# Patient Record
Sex: Male | Born: 1990 | Race: White | Hispanic: No | Marital: Single | Smoking: Former smoker
Health system: Southern US, Community
[De-identification: ages and names within clinical notes are randomized; demographics above are authoritative.]

---
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
## 2012-07-06 ENCOUNTER — Ambulatory Visit: Payer: Self-pay | Admitting: Internal Medicine

## 2012-07-06 LAB — RAPID STREP-A WITH REFLX: Micro Text Report: NEGATIVE

## 2012-07-09 LAB — BETA STREP CULTURE(ARMC)

## 2013-06-18 ENCOUNTER — Ambulatory Visit: Payer: Self-pay | Admitting: Family Medicine

## 2013-06-28 ENCOUNTER — Ambulatory Visit: Payer: Self-pay | Admitting: Neurology

## 2013-07-01 ENCOUNTER — Ambulatory Visit: Payer: Self-pay | Admitting: Neurology

## 2014-01-30 HISTORY — PX: WRIST FRACTURE SURGERY: SHX121

## 2014-12-27 IMAGING — CR PARANASAL SINUSES - COMPLETE 3 + VIEW
1 series · 5 of 5 positions shown · non-contrast
Comparison: none

REASON FOR EXAM: headaches
COMMENTS:

PROCEDURE:     MDR - MDR SINUSES PARANASAL COMPLETE  - June 18, 2013  [DATE]
RESULT:

[Series 1: pa waters · 0.17mm/px · 5 of 5 slices shown]
[im 1/5]
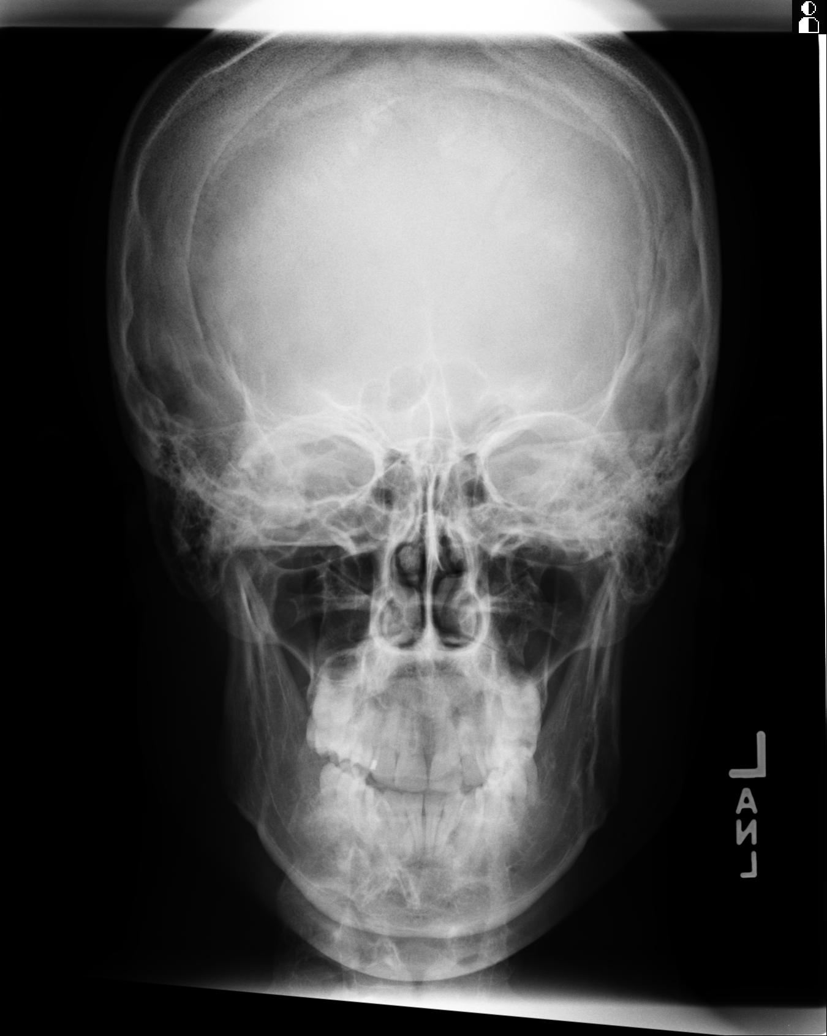
[im 2/5]
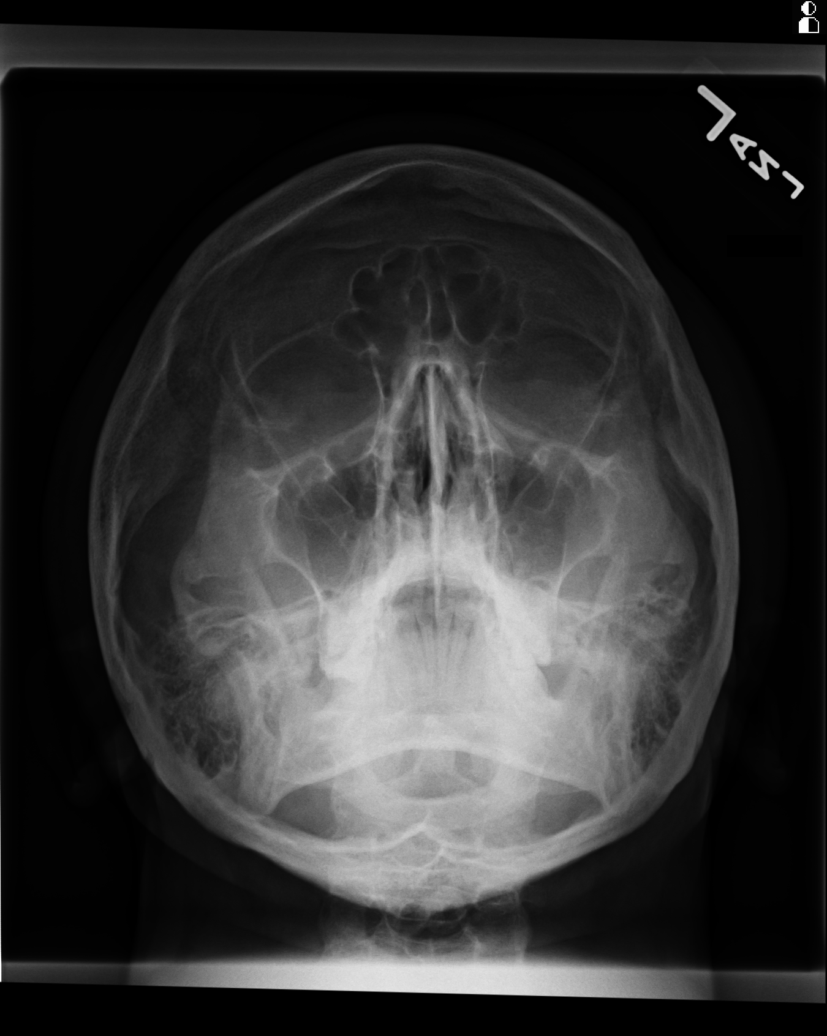
[im 3/5]
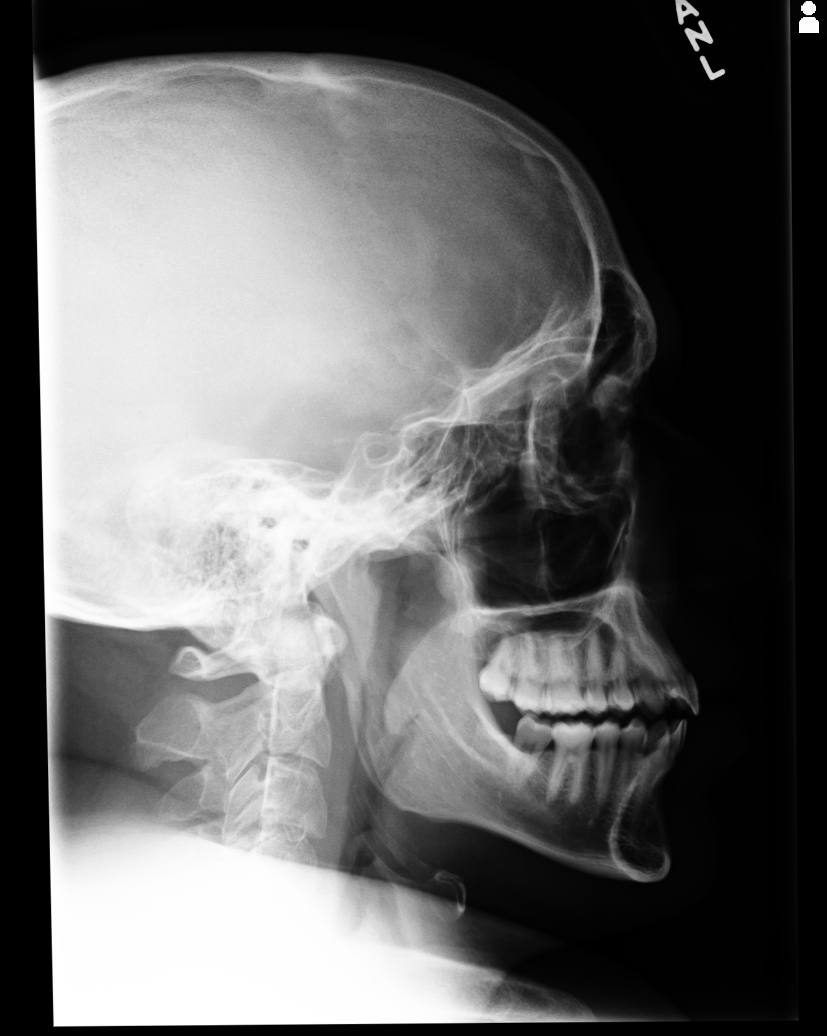
[im 4/5]
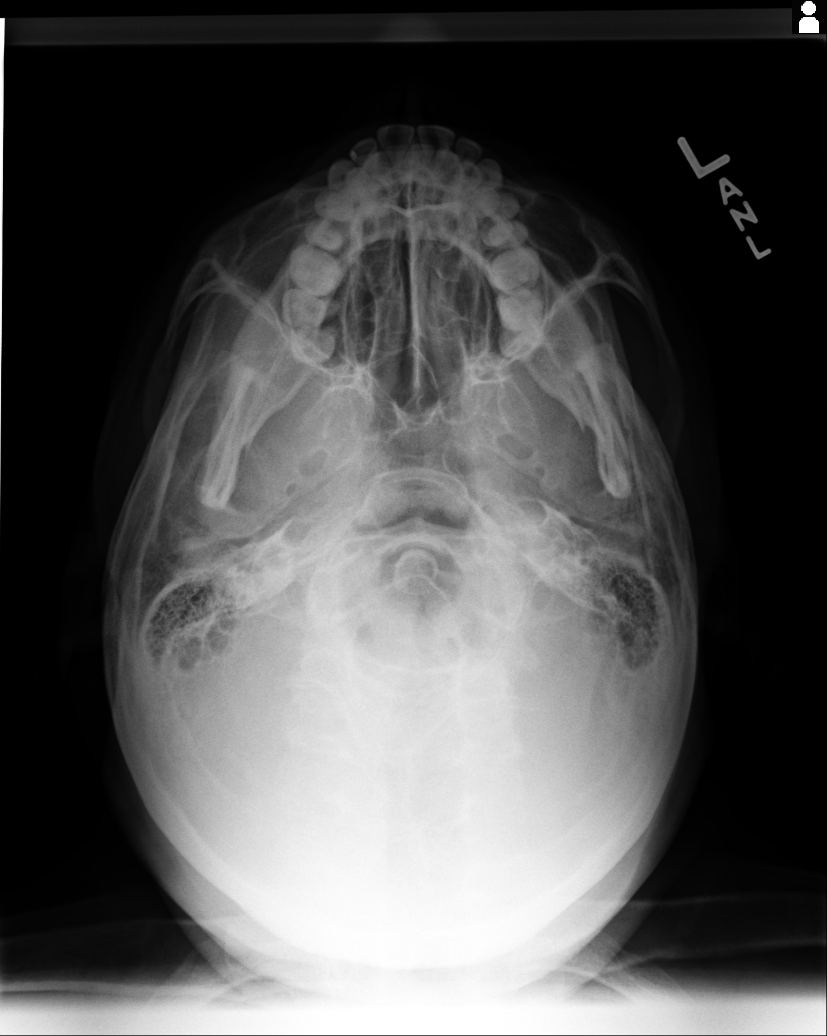
[im 5/5]
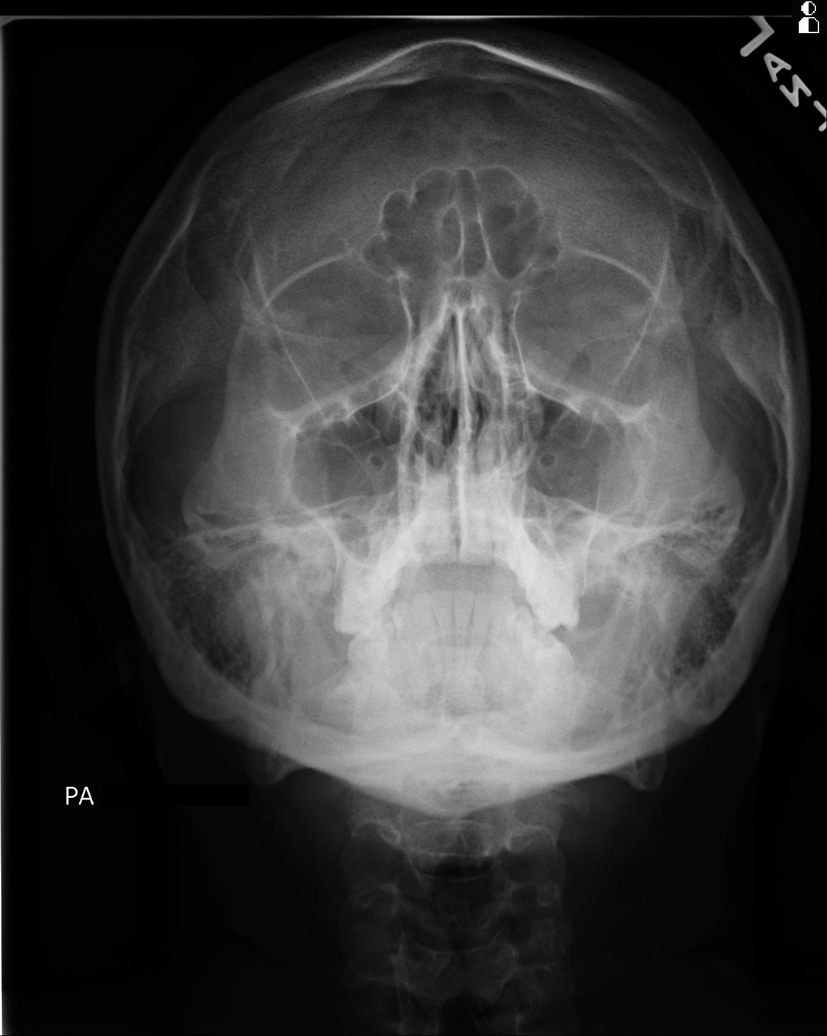

[5 of 5 positions shown; findings below may reference images not displayed]

FINDINGS: There is no evidence of mucosal thickening or air-fluid levels
within the paranasal sinuses. The osseous structures demonstrate no evidence
of fracture or dislocation.
IMPRESSION: 1. Unremarkable paranasal sinus series as described above. If there is
persistent clinical concern, further evaluation with sinus protocol CT is
recommended.

## 2015-01-09 IMAGING — US US CAROTID DUPLEX BILAT
1 series · 14 of 24 positions shown · non-contrast
Comparison: none

REASON FOR EXAM: HA
COMMENTS:

PROCEDURE:     ALEXANDER OMAR - ALEXANDER OMAR CAROTID DOPPLER BILATERAL  - July 01, 2013  [DATE]
RESULT:     History: Headache.
Comparison Study: No prior.

[Series 1: us carotid duplex bilat · 0.07mm/px · 14 of 62 slices shown]
[im 1/62]
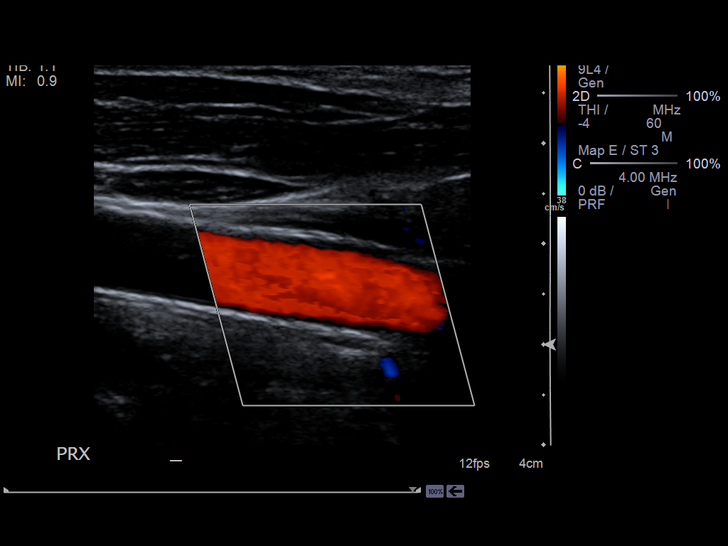
[im 6/62]
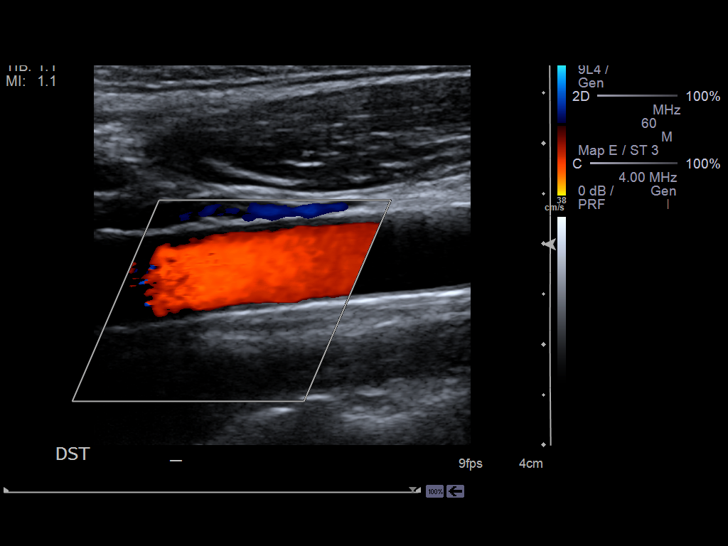
[im 11/62]
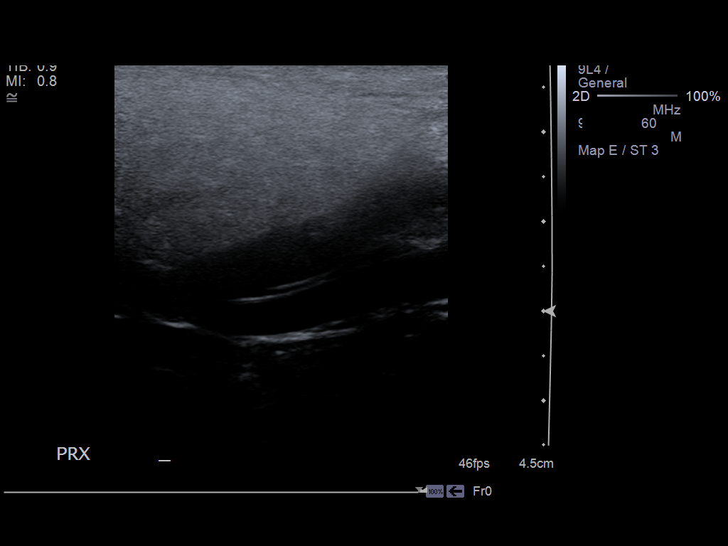
[im 16/62]
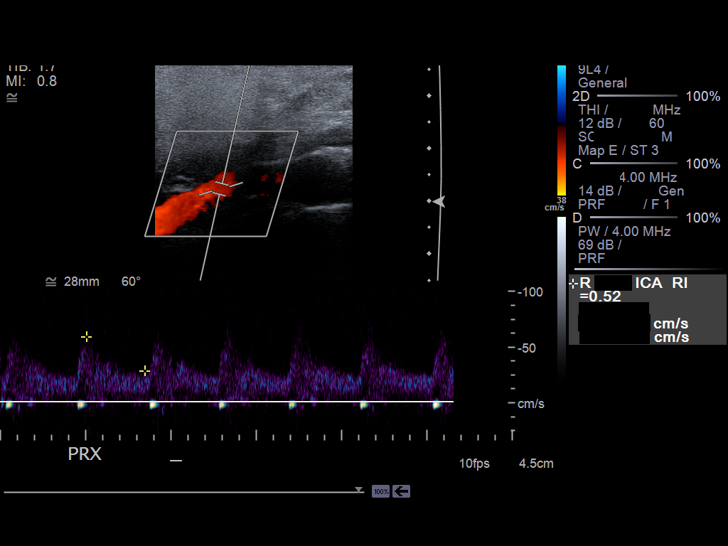
[im 19/62]
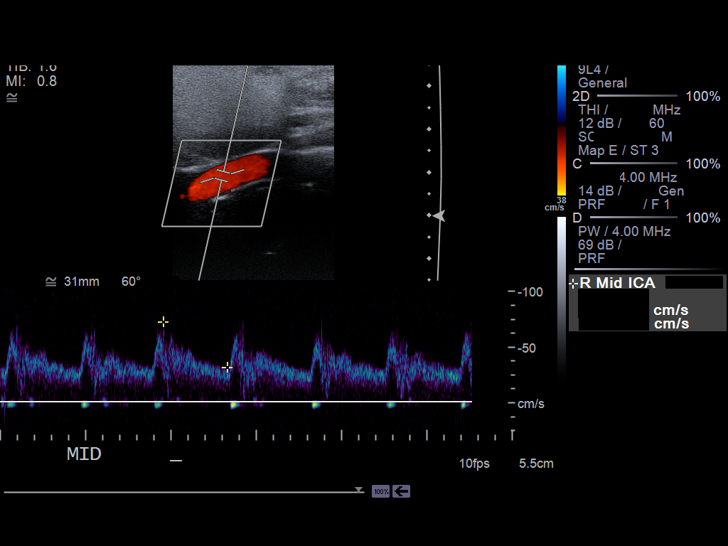
[im 24/62]
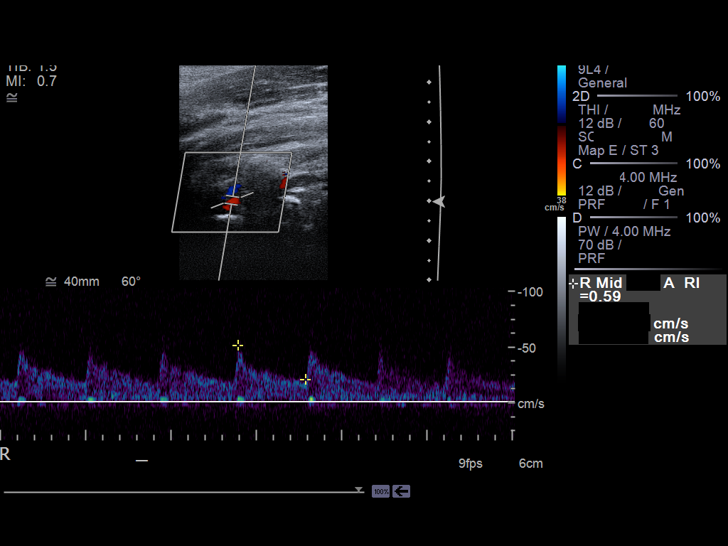
[im 30/62]
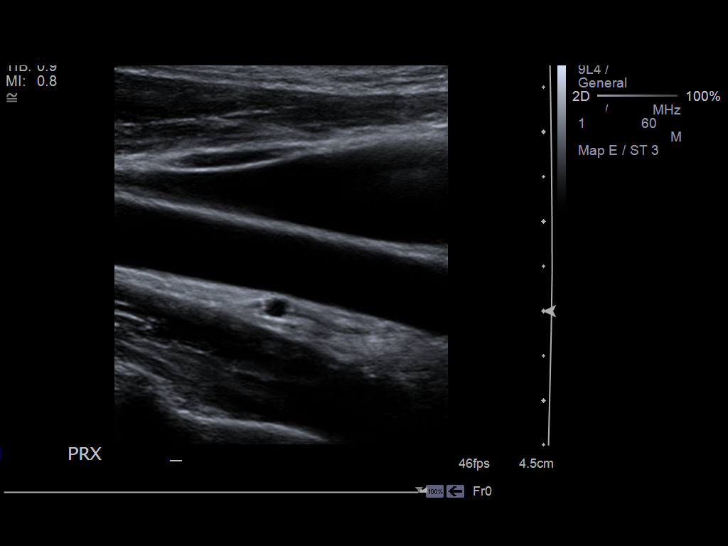
[im 32/62]
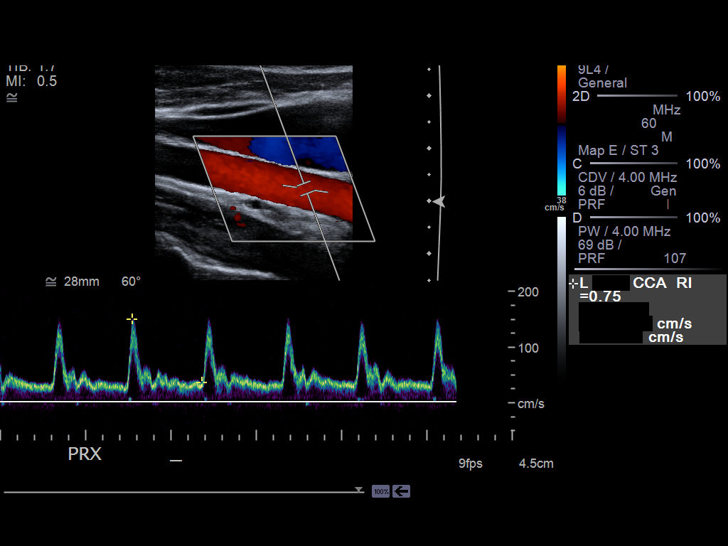
[im 38/62]
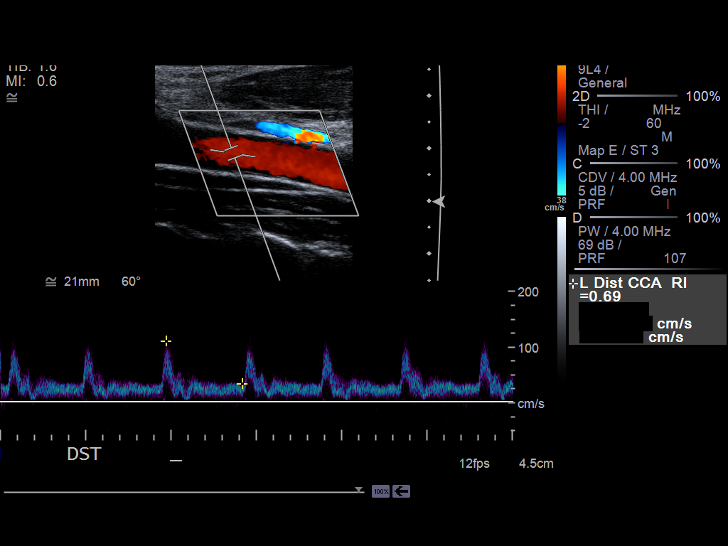
[im 43/62]
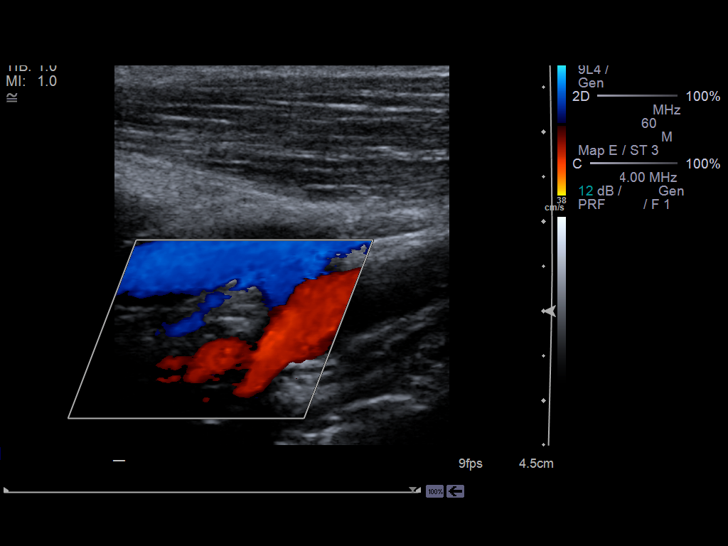
[im 48/62]
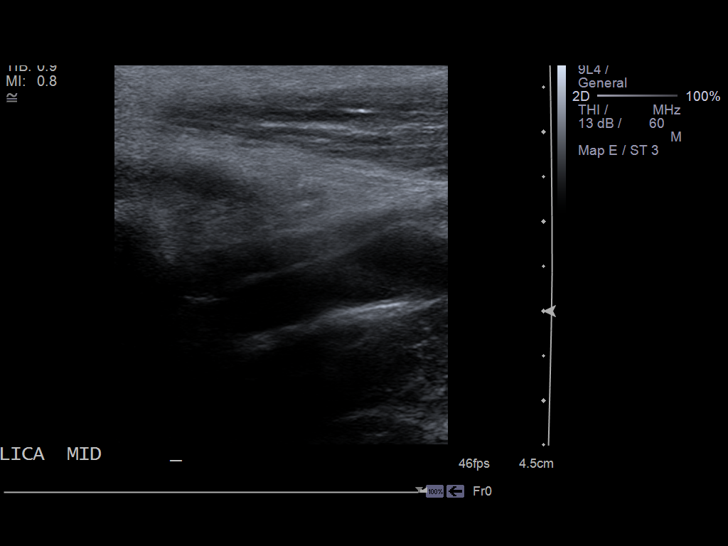
[im 51/62]
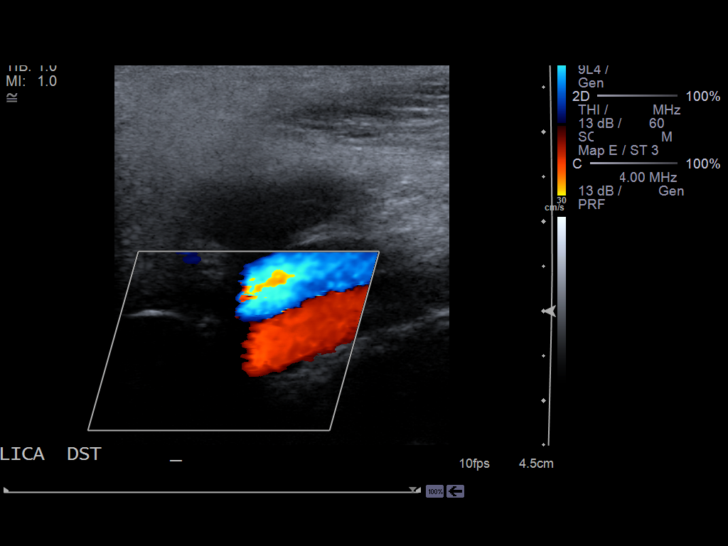
[im 56/62]
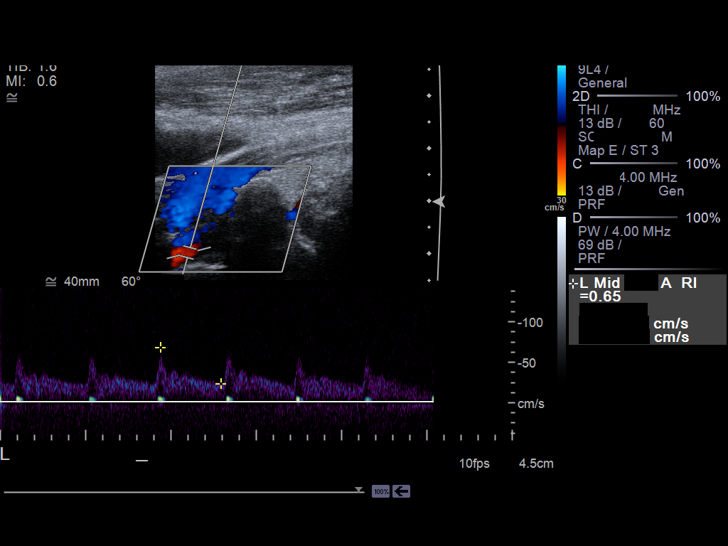
[im 62/62]
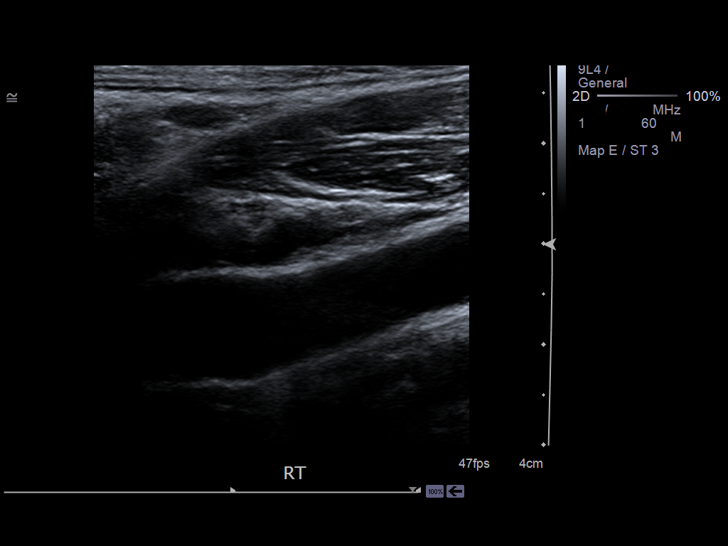

[14 of 24 positions shown; findings below may reference images not displayed]

FINDINGS: Bilateral carotid color flow duplex Doppler is normal. No
significant plaque. Peak systolic flow velocity ratio right is 0.5, left
0.6. Vertebrals are patent with antegrade flow.
IMPRESSION: No significant abnormality.

## 2015-05-04 ENCOUNTER — Other Ambulatory Visit: Payer: Self-pay

## 2016-03-01 ENCOUNTER — Encounter: Payer: Self-pay | Admitting: Family Medicine

## 2016-03-01 ENCOUNTER — Ambulatory Visit (INDEPENDENT_AMBULATORY_CARE_PROVIDER_SITE_OTHER): Payer: 59 | Admitting: Family Medicine

## 2016-03-01 ENCOUNTER — Other Ambulatory Visit
Admission: RE | Admit: 2016-03-01 | Discharge: 2016-03-01 | Disposition: A | Discharge status: Home / Self Care | Payer: 59 | Source: Ambulatory Visit | Attending: Family Medicine | Admitting: Family Medicine

## 2016-03-01 ENCOUNTER — Ambulatory Visit
Admission: RE | Admit: 2016-03-01 | Discharge: 2016-03-01 | Disposition: A | Discharge status: Home / Self Care | Payer: 59 | Source: Ambulatory Visit | Attending: Family Medicine | Admitting: Family Medicine

## 2016-03-01 ENCOUNTER — Ambulatory Visit: Payer: Self-pay

## 2016-03-01 VITALS — BP 130/70 | HR 100 | Temp 100.9°F | Ht 71.0 in | Wt 199.0 lb

## 2016-03-01 DIAGNOSIS — J029 Acute pharyngitis, unspecified: Secondary | ICD-10-CM

## 2016-03-01 DIAGNOSIS — R509 Fever, unspecified: Secondary | ICD-10-CM | POA: Diagnosis not present

## 2016-03-01 LAB — CBC WITH DIFFERENTIAL/PLATELET
Basophils Absolute: 0.1 10*3/uL (ref 0–0.1)
Basophils Relative: 1 %
EOS ABS: 0.2 10*3/uL (ref 0–0.7)
Eosinophils Relative: 1 %
HCT: 44.5 % (ref 40.0–52.0)
Hemoglobin: 15.5 g/dL (ref 13.0–18.0)
LYMPHS ABS: 1.2 10*3/uL (ref 1.0–3.6)
Lymphocytes Relative: 9 %
MCH: 30 pg (ref 26.0–34.0)
MCHC: 34.9 g/dL (ref 32.0–36.0)
MCV: 85.9 fL (ref 80.0–100.0)
Monocytes Absolute: 1.3 10*3/uL — ABNORMAL HIGH (ref 0.2–1.0)
Neutro Abs: 10.5 10*3/uL — ABNORMAL HIGH (ref 1.4–6.5)
Neutrophils Relative %: 79 %
PLATELETS: 213 10*3/uL (ref 150–440)
RBC: 5.17 MIL/uL (ref 4.40–5.90)
RDW: 12.7 % (ref 11.5–14.5)
WBC: 13.2 10*3/uL — ABNORMAL HIGH (ref 3.8–10.6)

## 2016-03-01 LAB — POCT RAPID STREP A (OFFICE): Rapid Strep A Screen: NEGATIVE

## 2016-03-01 LAB — POCT INFLUENZA A/B
INFLUENZA A, POC: NEGATIVE
Influenza B, POC: NEGATIVE

## 2016-03-01 MED ORDER — DOXYCYCLINE HYCLATE 100 MG PO TABS: 100.0000 mg | ORAL_TABLET | Freq: Two times a day (BID) | ORAL | Status: DC

## 2016-03-01 NOTE — Progress Notes (Signed)
Name: Noah Montgomery   MRN: [id number]    DOB: [birth year]   Date:03/01/2016       Progress Note  Subjective  Chief Complaint  Chief Complaint  Patient presents with  . Cough    nasal drainage, legs aching, hard to breathe    Cough This is a new problem. The current episode started in the past 7 days. Associated symptoms include chills, a fever, headaches, myalgias, nasal congestion, postnasal drip, a sore throat, shortness of breath and wheezing. Pertinent negatives include no chest pain, ear congestion, ear pain, heartburn, rash, rhinorrhea, sweats or weight loss. Nothing aggravates the symptoms. Treatments tried: mucinex/ nyquil. The treatment provided mild relief. There is no history of asthma, bronchiectasis, bronchitis, COPD, emphysema, environmental allergies or pneumonia.    No problem-specific assessment & plan notes found for this encounter.   No past medical history on file.  Past Surgical History  Procedure Laterality Date  . Wrist fracture surgery  01/30/2014    No family history on file.  Social History   Social History  . Marital Status: Single    Spouse Name: N/A  . Number of Children: N/A  . Years of Education: N/A   Occupational History  . Not on file.   Social History Main Topics  . Smoking status: Former Games developer  . Smokeless tobacco: Not on file  . Alcohol Use: 0.0 oz/week    0 Standard drinks or equivalent per week  . Drug Use: No  . Sexual Activity: Not on file   Other Topics Concern  . Not on file   Social History Narrative  . No narrative on file    No Known Allergies   Review of Systems  Constitutional: Positive for fever and chills. Negative for weight loss and malaise/fatigue.  HENT: Positive for postnasal drip and sore throat. Negative for ear discharge, ear pain and rhinorrhea.   Eyes: Negative for blurred vision.  Respiratory: Positive for cough, shortness of breath and wheezing. Negative for sputum production.    Cardiovascular: Negative for chest pain, palpitations and leg swelling.  Gastrointestinal: Negative for heartburn, nausea, abdominal pain, diarrhea, constipation, blood in stool and melena.  Genitourinary: Negative for dysuria, urgency, frequency and hematuria.  Musculoskeletal: Positive for myalgias. Negative for back pain, joint pain and neck pain.  Skin: Negative for rash.  Neurological: Positive for headaches. Negative for dizziness, tingling, sensory change and focal weakness.  Endo/Heme/Allergies: Negative for environmental allergies and polydipsia. Does not bruise/bleed easily.  Psychiatric/Behavioral: Negative for depression and suicidal ideas. The patient is not nervous/anxious and does not have insomnia.      Objective  Filed Vitals:   03/01/16 1108  BP: 130/70  Pulse: 100  Temp: 100.9 F (38.3 C)  TempSrc: Oral  Height:  (1.803 m)  Weight: 199 lb (90.266 kg)  SpO2: 98%    Physical Exam  Constitutional: He is oriented to person, place, and time and well-developed, well-nourished, and in no distress.  HENT:  Head: Normocephalic.  Right Ear: External ear normal.  Left Ear: External ear normal.  Nose: Nose normal.  Mouth/Throat: Posterior oropharyngeal erythema present.  Eyes: Conjunctivae and EOM are normal. Pupils are equal, round, and reactive to light. Right eye exhibits no discharge. Left eye exhibits no discharge. No scleral icterus.  Neck: Normal range of motion. Neck supple. No JVD present. No tracheal deviation present. No thyromegaly present.  Cardiovascular: Normal rate, regular rhythm, normal heart sounds and intact distal pulses.  Exam reveals no gallop and  no friction rub.   No murmur heard. Pulmonary/Chest: Breath sounds normal. No respiratory distress. He has no wheezes. He has no rales.  Abdominal: Soft. Bowel sounds are normal. He exhibits no mass. There is no hepatosplenomegaly. There is no tenderness. There is no rebound, no guarding and no CVA  tenderness.  Musculoskeletal: Normal range of motion. He exhibits no edema or tenderness.  Lymphadenopathy:    He has no cervical adenopathy.  Neurological: He is alert and oriented to person, place, and time. He has normal sensation, normal strength, normal reflexes and intact cranial nerves. No cranial nerve deficit.  Skin: Skin is warm. No rash noted. No erythema. No pallor.  Psychiatric: Mood and affect normal.  Nursing note and vitals reviewed.     Assessment & Plan  Problem List Items Addressed This Visit    None    Visit Diagnoses    Pharyngitis    -  Primary    Relevant Orders    POCT rapid strep A (Completed)    Fever and chills        Relevant Medications    doxycycline (VIBRA-TABS) 100 MG tablet    Other Relevant Orders    POCT Influenza A/B (Completed)    DG Chest 2 View         Dr[facility] Elizabeth [facility]uereanna Jones [facility]  03/01/2016

## 2016-05-05 ENCOUNTER — Encounter: Payer: Self-pay | Admitting: Family Medicine

## 2016-05-05 ENCOUNTER — Ambulatory Visit (INDEPENDENT_AMBULATORY_CARE_PROVIDER_SITE_OTHER): Payer: 59 | Admitting: Family Medicine

## 2016-05-05 VITALS — BP 100/70 | HR 78 | Ht 71.0 in | Wt 196.0 lb

## 2016-05-05 DIAGNOSIS — F900 Attention-deficit hyperactivity disorder, predominantly inattentive type: Secondary | ICD-10-CM

## 2016-05-05 DIAGNOSIS — Z202 Contact with and (suspected) exposure to infections with a predominantly sexual mode of transmission: Secondary | ICD-10-CM

## 2016-05-05 MED ORDER — DOXYCYCLINE HYCLATE 100 MG PO TABS: 100.0000 mg | ORAL_TABLET | Freq: Two times a day (BID) | ORAL | Status: DC

## 2016-05-05 MED ORDER — AMPHETAMINE-DEXTROAMPHETAMINE 20 MG PO TABS: 20.0000 mg | ORAL_TABLET | Freq: Two times a day (BID) | ORAL | Status: DC

## 2016-05-05 MED ORDER — AMPHETAMINE-DEXTROAMPHET ER 20 MG PO CP24: 20.0000 mg | ORAL_CAPSULE | ORAL | Status: DC | PRN

## 2016-05-05 NOTE — Progress Notes (Signed)
Name: Noah Montgomery   MRN: [id number]    DOB: [birth year]   Date:05/05/2016       Progress Note  Subjective  Chief Complaint  Chief Complaint  Patient presents with  . ADHD    working 2 jobs  . Rash    noticed a "pimple at belt line- popped and is gone now"- STD check    HPI Comments: Patient desires to resume ADHD treatment.  Rash This is a new problem. The current episode started 1 to 4 weeks ago. The problem has been gradually improving since onset. The affected locations include the groin. The rash is characterized by blistering. He was exposed to nothing. Pertinent negatives include no anorexia, congestion, cough, diarrhea, eye pain, facial edema, fatigue, fever, joint pain, nail changes, rhinorrhea, shortness of breath, sore throat or vomiting. Past treatments include nothing. The treatment provided mild relief.    No problem-specific assessment & plan notes found for this encounter.   History reviewed. No pertinent past medical history.  Past Surgical History  Procedure Laterality Date  . Wrist fracture surgery  01/30/2014    History reviewed. No pertinent family history.  Social History   Social History  . Marital Status: Single    Spouse Name: N/A  . Number of Children: N/A  . Years of Education: N/A   Occupational History  . Not on file.   Social History Main Topics  . Smoking status: Former Games developermoker  . Smokeless tobacco: Not on file  . Alcohol Use: 0.0 oz/week    0 Standard drinks or equivalent per week  . Drug Use: No  . Sexual Activity: Not on file   Other Topics Concern  . Not on file   Social History Narrative    No Known Allergies   Review of Systems  Constitutional: Negative for fever, chills, weight loss, malaise/fatigue and fatigue.  HENT: Negative for congestion, ear discharge, ear pain, rhinorrhea and sore throat.   Eyes: Negative for blurred vision and pain.  Respiratory: Negative for cough, sputum production, shortness of breath  and wheezing.   Cardiovascular: Negative for chest pain, palpitations and leg swelling.  Gastrointestinal: Negative for heartburn, nausea, vomiting, abdominal pain, diarrhea, constipation, blood in stool, melena and anorexia.  Genitourinary: Negative for dysuria, urgency, frequency and hematuria.  Musculoskeletal: Negative for myalgias, back pain, joint pain and neck pain.  Skin: Positive for rash. Negative for nail changes.  Neurological: Negative for dizziness, tingling, sensory change, focal weakness and headaches.  Endo/Heme/Allergies: Negative for environmental allergies and polydipsia. Does not bruise/bleed easily.  Psychiatric/Behavioral: Negative for depression and suicidal ideas. The patient is not nervous/anxious and does not have insomnia.      Objective  Filed Vitals:   05/05/16 1434  BP: 100/70  Pulse: 78  Height: 5\' 11"  (1.803 m)  Weight: 196 lb (88.905 kg)    Physical Exam  Constitutional: He is oriented to person, place, and time and well-developed, well-nourished, and in no distress.  HENT:  Head: Normocephalic.  Right Ear: External ear normal.  Left Ear: External ear normal.  Nose: Nose normal.  Mouth/Throat: Oropharynx is clear and moist.  Eyes: Conjunctivae and EOM are normal. Pupils are equal, round, and reactive to light. Right eye exhibits no discharge. Left eye exhibits no discharge. No scleral icterus.  Neck: Normal range of motion. Neck supple. No JVD present. No tracheal deviation present. No thyromegaly present.  Cardiovascular: Normal rate, regular rhythm, normal heart sounds and intact distal pulses.  Exam reveals no gallop  and no friction rub.   No murmur heard. Pulmonary/Chest: Breath sounds normal. No respiratory distress. He has no wheezes. He has no rales.  Abdominal: Soft. Bowel sounds are normal. He exhibits no mass. There is no hepatosplenomegaly. There is no tenderness. There is no rebound, no guarding and no CVA tenderness.  Musculoskeletal:  Normal range of motion. He exhibits no edema or tenderness.  Lymphadenopathy:    He has no cervical adenopathy.  Neurological: He is alert and oriented to person, place, and time. He has normal sensation, normal strength and intact cranial nerves. No cranial nerve deficit.  Skin: Skin is warm. No rash noted. There is erythema.  Unroofed vesicle  Psychiatric: Mood and affect normal.  Nursing note and vitals reviewed.     Assessment & Plan  Problem List Items Addressed This Visit    None    Visit Diagnoses    Attention deficit hyperactivity disorder (ADHD), predominantly inattentive type    -  Primary    Relevant Medications    amphetamine-dextroamphetamine (ADDERALL XR) 20 MG 24 hr capsule    Potential exposure to STD        Relevant Medications    doxycycline (VIBRA-TABS) 100 MG tablet    Other Relevant Orders    HIV antibody    GC/chlamydia probe amp, genital    RPR         Dr. Hayden Rasmusseneanna Kayslee Furey [facility]  05/05/2016

## 2016-05-06 LAB — HIV ANTIBODY (ROUTINE TESTING W REFLEX): HIV Screen 4th Generation wRfx: NONREACTIVE

## 2016-05-06 LAB — RPR: RPR Ser Ql: NONREACTIVE

## 2016-06-22 ENCOUNTER — Other Ambulatory Visit: Payer: Self-pay

## 2016-07-25 ENCOUNTER — Other Ambulatory Visit: Payer: Self-pay

## 2016-09-19 ENCOUNTER — Other Ambulatory Visit: Payer: Self-pay

## 2017-09-09 IMAGING — CR DG CHEST 2V
2 series · 2 of 2 positions shown · non-contrast
Comparison: None.

CLINICAL DATA: Cough and fever for 2 days.

EXAM:
CHEST  2 VIEW

[chest pa]
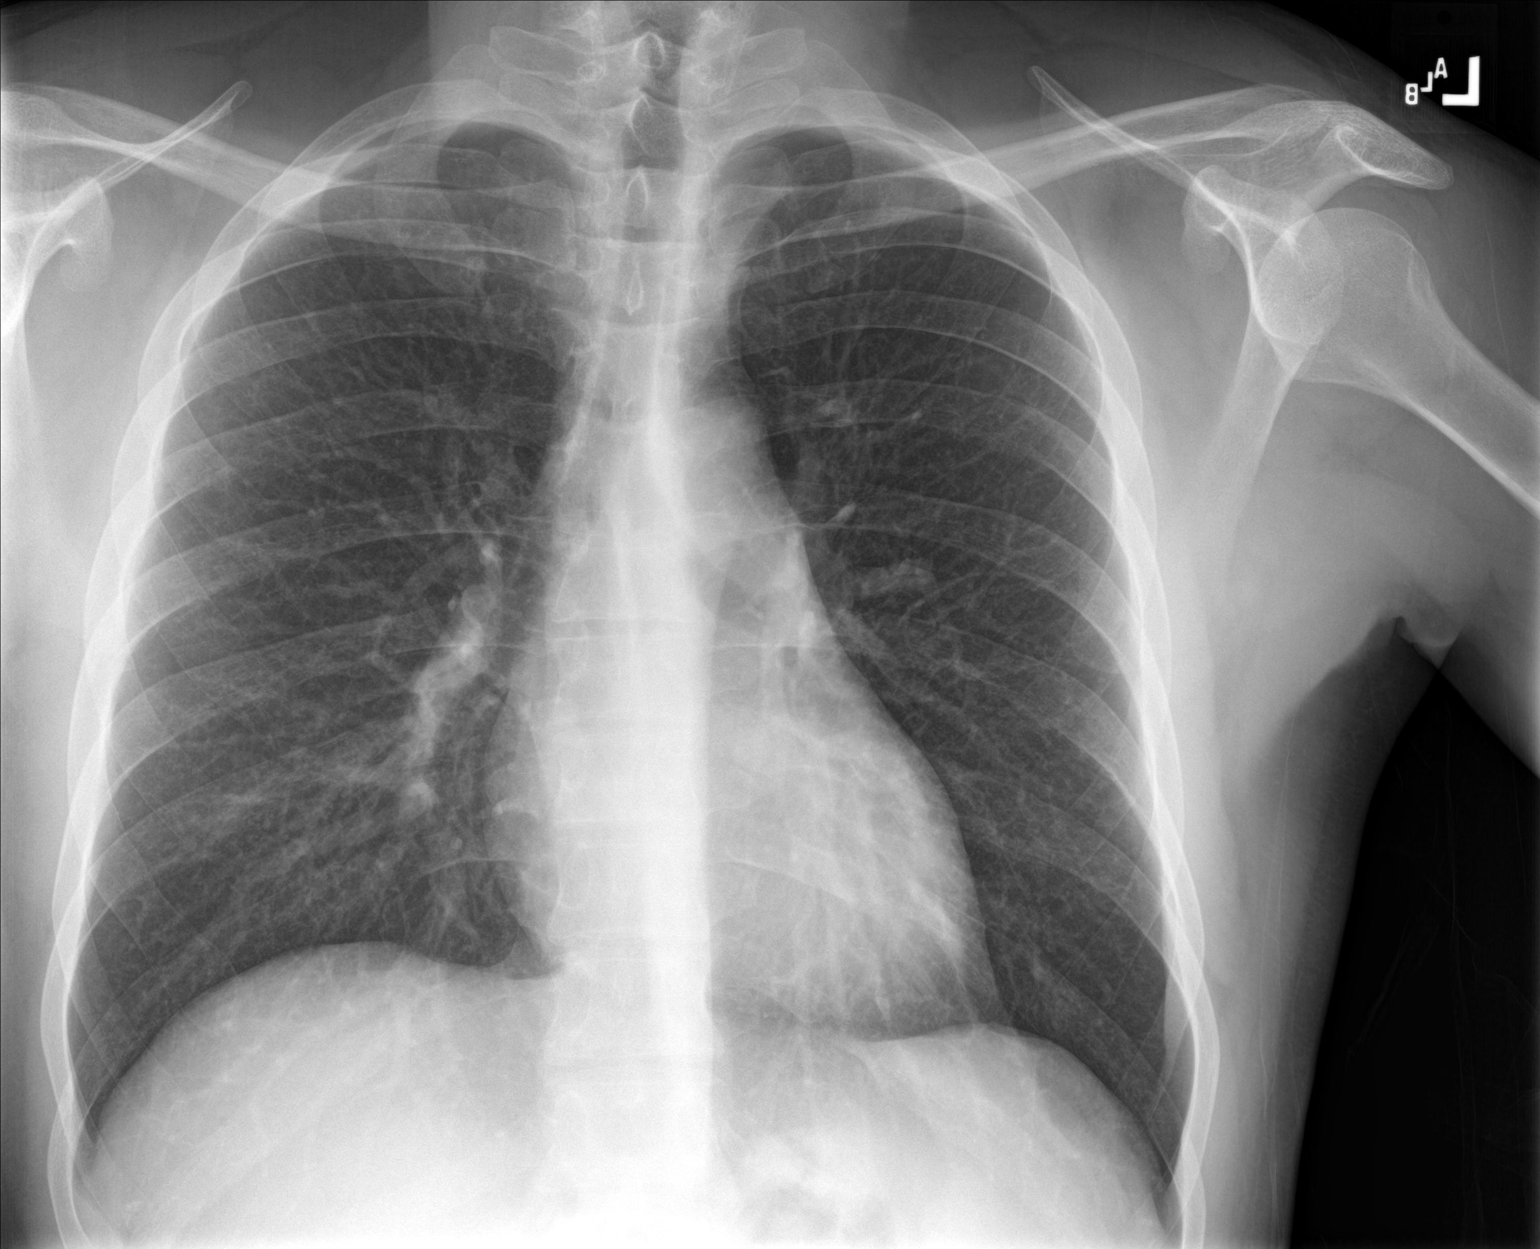

[chest lat]
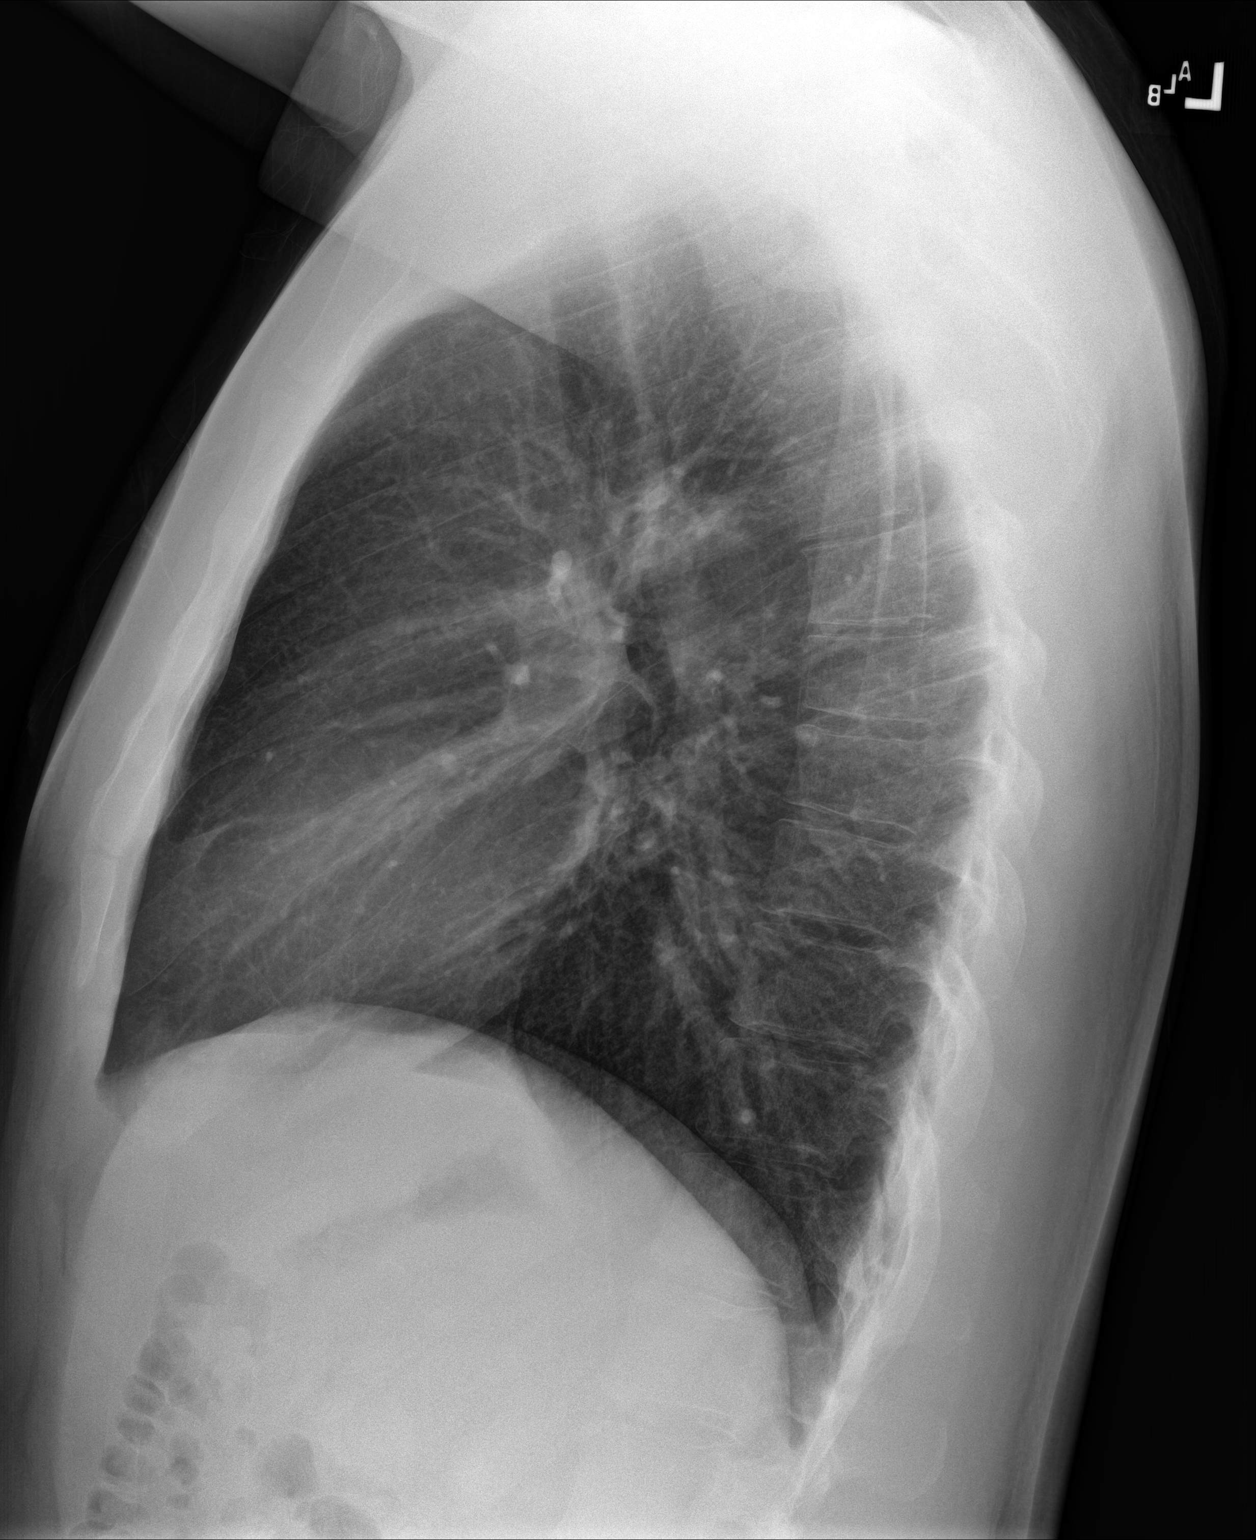

[2 of 2 positions shown; findings below may reference images not displayed]

FINDINGS: The heart size and mediastinal contours are within normal limits.
Both lungs are clear. The visualized skeletal structures are
unremarkable.
IMPRESSION: Normal chest x-ray.

## 2017-11-15 ENCOUNTER — Encounter: Payer: Self-pay | Admitting: Family Medicine

## 2017-11-15 ENCOUNTER — Ambulatory Visit (INDEPENDENT_AMBULATORY_CARE_PROVIDER_SITE_OTHER): Payer: BLUE CROSS/BLUE SHIELD | Admitting: Family Medicine

## 2017-11-15 VITALS — BP 120/80 | HR 80 | Temp 97.9°F | Ht 71.0 in | Wt 199.0 lb

## 2017-11-15 DIAGNOSIS — F900 Attention-deficit hyperactivity disorder, predominantly inattentive type: Secondary | ICD-10-CM | POA: Diagnosis not present

## 2017-11-15 DIAGNOSIS — Z23 Encounter for immunization: Secondary | ICD-10-CM | POA: Diagnosis not present

## 2017-11-15 DIAGNOSIS — J0100 Acute maxillary sinusitis, unspecified: Secondary | ICD-10-CM | POA: Diagnosis not present

## 2017-11-15 MED ORDER — AMPHETAMINE-DEXTROAMPHET ER 20 MG PO CP24: 20.0000 mg | ORAL_CAPSULE | ORAL | 0 refills | Status: DC | PRN

## 2017-11-15 MED ORDER — AZITHROMYCIN 250 MG PO TABS: ORAL_TABLET | ORAL | 0 refills | Status: AC

## 2017-11-15 NOTE — Progress Notes (Signed)
Name: Noah RoupRobert R Montgomery   MRN: [id number]    DOB: 1991-09-23   Date:11/15/2017       Progress Note  Subjective  Chief Complaint  Chief Complaint  Patient presents with  . Sinusitis    congestion, facial pain under eyes, nasal congestion    Patient presents for refill adhd medication.  Noah Montgomery is a 27 y.o. male who presents today for his Complete Annual Exam. He feels well. He reports exercising occasional. He reports he is sleeping well.    Sinusitis  This is a new problem. The current episode started yesterday. The problem has been gradually worsening since onset. There has been no fever. The pain is mild. Associated symptoms include congestion, coughing, sinus pressure, sneezing and a sore throat. Pertinent negatives include no chills, diaphoresis, ear pain, headaches, hoarse voice, neck pain, shortness of breath or swollen glands. (Nasal discharge yellow/green) Past treatments include nothing (immune gummies). The treatment provided no relief.    No problem-specific Assessment & Plan notes found for this encounter.   History reviewed. No pertinent past medical history.  Past Surgical History:  Procedure Laterality Date  . WRIST FRACTURE SURGERY  01/30/2014    History reviewed. No pertinent family history.  Social History   Socioeconomic History  . Marital status: Single    Spouse name: Not on file  . Number of children: Not on file  . Years of education: Not on file  . Highest education level: Not on file  Social Needs  . Financial resource strain: Not on file  . Food insecurity - worry: Not on file  . Food insecurity - inability: Not on file  . Transportation needs - medical: Not on file  . Transportation needs - non-medical: Not on file  Occupational History  . Not on file  Tobacco Use  . Smoking status: Former Games developermoker  . Smokeless tobacco: Never Used  Substance and Sexual Activity  . Alcohol use: Yes    Alcohol/week: 0.0 oz  . Drug use: No  .  Sexual activity: Not on file  Other Topics Concern  . Not on file  Social History Narrative  . Not on file    No Known Allergies  Outpatient Medications Prior to Visit  Medication Sig Dispense Refill  . amphetamine-dextroamphetamine (ADDERALL XR) 20 MG 24 hr capsule Take 1 capsule (20 mg total) by mouth as needed. Reported on 05/05/2016 30 capsule 0  . doxycycline (VIBRA-TABS) 100 MG tablet Take 1 tablet (100 mg total) by mouth 2 (two) times daily. 20 tablet 0   No facility-administered medications prior to visit.     Review of Systems  Constitutional: Negative for chills, diaphoresis, fever, malaise/fatigue and weight loss.  HENT: Positive for congestion, sinus pressure, sneezing and sore throat. Negative for ear discharge, ear pain and hoarse voice.   Eyes: Negative for blurred vision.  Respiratory: Positive for cough. Negative for sputum production, shortness of breath and wheezing.   Cardiovascular: Negative for chest pain, palpitations and leg swelling.  Gastrointestinal: Negative for abdominal pain, blood in stool, constipation, diarrhea, heartburn, melena and nausea.  Genitourinary: Negative for dysuria, frequency, hematuria and urgency.  Musculoskeletal: Negative for back pain, joint pain, myalgias and neck pain.  Skin: Negative for rash.  Neurological: Negative for dizziness, tingling, sensory change, focal weakness and headaches.  Endo/Heme/Allergies: Negative for environmental allergies and polydipsia. Does not bruise/bleed easily.  Psychiatric/Behavioral: Negative for depression and suicidal ideas. The patient is not nervous/anxious and does not have insomnia.  Objective  Vitals:   11/15/17 0920  BP: 120/80  Pulse: 80  Temp: 97.9 F (36.6 C)  TempSrc: Oral  Weight: 199 lb (90.3 kg)  Height: 5\' 11"  (1.803 m)    Physical Exam  Constitutional: He is oriented to person, place, and time and well-developed, well-nourished, and in no distress.  HENT:  Head:  Normocephalic.  Right Ear: Tympanic membrane and external ear normal.  Left Ear: Tympanic membrane and external ear normal.  Nose: Mucosal edema present. Right sinus exhibits maxillary sinus tenderness. Left sinus exhibits maxillary sinus tenderness.  Mouth/Throat: Posterior oropharyngeal erythema present. No oropharyngeal exudate, posterior oropharyngeal edema or tonsillar abscesses.  Eyes: Conjunctivae and EOM are normal. Pupils are equal, round, and reactive to light. Right eye exhibits no discharge. Left eye exhibits no discharge. No scleral icterus.  Neck: Normal range of motion. Neck supple. No JVD present. No tracheal deviation present. No thyromegaly present.  Cardiovascular: Normal rate, regular rhythm, normal heart sounds and intact distal pulses. Exam reveals no gallop and no friction rub.  No murmur heard. Pulmonary/Chest: Breath sounds normal. No respiratory distress. He has no wheezes. He has no rales.  Abdominal: Soft. Bowel sounds are normal. He exhibits no mass. There is no hepatosplenomegaly. There is no tenderness. There is no rebound, no guarding and no CVA tenderness.  Musculoskeletal: Normal range of motion. He exhibits no edema or tenderness.  Lymphadenopathy:    He has no cervical adenopathy.  Neurological: He is alert and oriented to person, place, and time. He has normal sensation, normal strength, normal reflexes and intact cranial nerves. No cranial nerve deficit.  Skin: Skin is warm. No rash noted.  Psychiatric: Mood and affect normal.  Nursing note and vitals reviewed.     Assessment & Plan  Problem List Items Addressed This Visit    None    Visit Diagnoses    Acute maxillary sinusitis, recurrence not specified    -  Primary   Relevant Medications   azithromycin (ZITHROMAX) 250 MG tablet   Influenza vaccine needed       Relevant Orders   Flu Vaccine QUAD 36+ mos IM (Completed)   Need for diphtheria-tetanus-pertussis (Tdap) vaccine       Relevant Orders    Tdap vaccine greater than or equal to 7yo IM (Completed)   Attention deficit hyperactivity disorder (ADHD), predominantly inattentive type       Relevant Medications   amphetamine-dextroamphetamine (ADDERALL XR) 20 MG 24 hr capsule      Meds ordered this encounter  Medications  . amphetamine-dextroamphetamine (ADDERALL XR) 20 MG 24 hr capsule    Sig: Take 1 capsule (20 mg total) by mouth as needed. Reported on 05/05/2016    Dispense:  30 capsule    Refill:  0  . azithromycin (ZITHROMAX) 250 MG tablet    Sig: ZPack- use as directed    Dispense:  6 tablet    Refill:  0      Dr. Hayden [facility]  11/15/17

## 2018-01-28 ENCOUNTER — Other Ambulatory Visit: Payer: Self-pay

## 2018-01-28 DIAGNOSIS — F900 Attention-deficit hyperactivity disorder, predominantly inattentive type: Secondary | ICD-10-CM

## 2018-01-28 MED ORDER — AMPHETAMINE-DEXTROAMPHET ER 20 MG PO CP24: 20.0000 mg | ORAL_CAPSULE | ORAL | 0 refills | Status: AC | PRN
